# Patient Record
Sex: Male | Born: 1945 | Race: White | Hispanic: No | Marital: Married | Smoking: Former smoker
Health system: Southern US, Community
[De-identification: ages and names within clinical notes are randomized; demographics above are authoritative.]

## PROBLEM LIST (undated history)

## (undated) DIAGNOSIS — I10 Essential (primary) hypertension: Secondary | ICD-10-CM

---

## 2004-06-16 ENCOUNTER — Ambulatory Visit: Payer: Self-pay | Admitting: Unknown Physician Specialty

## 2004-11-29 ENCOUNTER — Emergency Department: Payer: Self-pay | Admitting: Emergency Medicine

## 2007-08-26 ENCOUNTER — Ambulatory Visit: Payer: Self-pay | Admitting: Unknown Physician Specialty

## 2010-03-09 ENCOUNTER — Ambulatory Visit: Payer: Self-pay | Admitting: Unknown Physician Specialty

## 2010-03-13 LAB — PATHOLOGY REPORT

## 2010-04-05 ENCOUNTER — Ambulatory Visit: Payer: Self-pay | Admitting: Family Medicine

## 2010-04-09 ENCOUNTER — Emergency Department: Payer: Self-pay | Admitting: Emergency Medicine

## 2010-04-12 ENCOUNTER — Ambulatory Visit: Payer: Self-pay | Admitting: Physician Assistant

## 2013-03-19 ENCOUNTER — Ambulatory Visit: Payer: Self-pay | Admitting: Unknown Physician Specialty

## 2013-03-23 LAB — PATHOLOGY REPORT

## 2015-04-07 ENCOUNTER — Other Ambulatory Visit (HOSPITAL_COMMUNITY): Payer: Self-pay | Admitting: Internal Medicine

## 2015-04-07 ENCOUNTER — Other Ambulatory Visit: Payer: Self-pay | Admitting: Internal Medicine

## 2015-04-07 DIAGNOSIS — Z7189 Other specified counseling: Secondary | ICD-10-CM

## 2015-04-13 ENCOUNTER — Ambulatory Visit (HOSPITAL_COMMUNITY)
Admission: RE | Admit: 2015-04-13 | Discharge: 2015-04-13 | Disposition: A | Discharge status: Home / Self Care | Payer: Medicare Other | Source: Ambulatory Visit | Attending: Internal Medicine | Admitting: Internal Medicine

## 2015-04-13 DIAGNOSIS — I517 Cardiomegaly: Secondary | ICD-10-CM | POA: Insufficient documentation

## 2015-04-13 DIAGNOSIS — I2510 Atherosclerotic heart disease of native coronary artery without angina pectoris: Secondary | ICD-10-CM | POA: Insufficient documentation

## 2015-04-13 DIAGNOSIS — Z7189 Other specified counseling: Secondary | ICD-10-CM | POA: Insufficient documentation

## 2015-04-13 DIAGNOSIS — M5134 Other intervertebral disc degeneration, thoracic region: Secondary | ICD-10-CM | POA: Diagnosis not present

## 2019-05-01 ENCOUNTER — Emergency Department: Payer: Medicare Other

## 2019-05-01 ENCOUNTER — Encounter: Payer: Self-pay | Admitting: Emergency Medicine

## 2019-05-01 ENCOUNTER — Other Ambulatory Visit: Payer: Self-pay

## 2019-05-01 ENCOUNTER — Emergency Department
Admission: EM | Admit: 2019-05-01 | Discharge: 2019-05-01 | Disposition: A | Discharge status: Home / Self Care | Payer: Medicare Other | Attending: Emergency Medicine | Admitting: Emergency Medicine

## 2019-05-01 DIAGNOSIS — R079 Chest pain, unspecified: Secondary | ICD-10-CM | POA: Diagnosis present

## 2019-05-01 DIAGNOSIS — Z5321 Procedure and treatment not carried out due to patient leaving prior to being seen by health care provider: Secondary | ICD-10-CM | POA: Insufficient documentation

## 2019-05-01 LAB — BASIC METABOLIC PANEL
Anion gap: 10 (ref 5–15)
BUN: 15 mg/dL (ref 8–23)
CO2: 26 mmol/L (ref 22–32)
Calcium: 9.3 mg/dL (ref 8.9–10.3)
Chloride: 95 mmol/L — ABNORMAL LOW (ref 98–111)
Creatinine, Ser: 0.9 mg/dL (ref 0.61–1.24)
GFR calc Af Amer: >60 mL/min (ref >60)
GFR calc non Af Amer: >60 mL/min (ref >60)
Glucose, Bld: 107 mg/dL — ABNORMAL HIGH (ref 70–99)
Potassium: 3.7 mmol/L (ref 3.5–5.1)
Sodium: 131 mmol/L — ABNORMAL LOW (ref 135–145)

## 2019-05-01 LAB — CBC
HCT: 37.9 % — ABNORMAL LOW (ref 39.0–52.0)
Hemoglobin: 13.6 g/dL (ref 13.0–17.0)
MCH: 30.6 pg (ref 26.0–34.0)
MCHC: 35.9 g/dL (ref 30.0–36.0)
MCV: 85.2 fL (ref 80.0–100.0)
Platelets: 204 10*3/uL (ref 150–400)
RBC: 4.45 MIL/uL (ref 4.22–5.81)
RDW: 13 % (ref 11.5–15.5)
WBC: 11.2 10*3/uL — ABNORMAL HIGH (ref 4.0–10.5)
nRBC: 0 % (ref 0.0–0.2)

## 2019-05-01 LAB — TROPONIN I (HIGH SENSITIVITY)
Troponin I (High Sensitivity): 4 ng/L (ref <18)
Troponin I (High Sensitivity): 5 ng/L (ref <18)

## 2019-05-01 MED ORDER — SODIUM CHLORIDE 0.9% FLUSH: 3.0000 mL | Freq: Once | INTRAVENOUS | Status: DC

## 2019-05-01 NOTE — ED Triage Notes (Signed)
Pt to ED via POV c/o chest pain and shortness of breath that started today. Pt states that the pain and shortness of breath has persisted throughout the day. Pain is generalized. Pt denies any other symptoms. Pt is in NAD. No cardiac hx per pt

## 2019-05-01 NOTE — ED Notes (Signed)
Pt up to desk to check on status, BP rechecked, EKG obtained.

## 2019-05-01 NOTE — ED Notes (Signed)
Patient's family member up to stat desk to ask about wait times. Patient updated by Colletta Maryland NT.

## 2019-05-01 NOTE — ED Notes (Signed)
Patient up to stat desk asking about wait times. Patient informed of current number of patients ahead of him to go back. Patient reported he was leaving. Patient cautioned against dangers of leaving AMA. Patient verbalized understanding of information discussed. Patient left.

## 2019-05-22 ENCOUNTER — Ambulatory Visit
Admission: EM | Admit: 2019-05-22 | Discharge: 2019-05-22 | Disposition: A | Discharge status: Home / Self Care | Payer: Medicare Other

## 2019-05-22 ENCOUNTER — Encounter: Payer: Self-pay | Admitting: *Deleted

## 2019-05-22 DIAGNOSIS — R5383 Other fatigue: Secondary | ICD-10-CM | POA: Diagnosis not present

## 2019-05-22 DIAGNOSIS — R000 Tachycardia, unspecified: Secondary | ICD-10-CM

## 2019-05-22 HISTORY — DX: Essential (primary) hypertension: I10

## 2019-05-22 NOTE — ED Provider Notes (Addendum)
Joshua Barrett    CSN: [id number] Arrival date & time: 05/22/19  1115      History   Chief Complaint Chief Complaint  Patient presents with  . Fatigue  . Cough    HPI Joshua Barrett is a 73 y.o. male.   Patient reports to urgent care today for 2-3 weeks of fatigue. Patient reports that he has felt generally fatigued since around Thanksgiving time. He also reports some episodes of what he thinks may have been exertional shortness of breath but also believes his general fatigue could be causing this.  He denies any chest pain today but describes pain across his shoulders, with his left shoulder being his primary problem. He describes the pain as "achey".  He denies associated nausea, vomiting or sweating. He denies fever or chills. He reports having to clear his throat occasionally. Denies sore throat, sinus congestion or runny nose currently but endorses occasional "sniffles" in the last 2 weeks. His symptoms have not worsened over the course but have also not largely improved.  Of note, Patient had cardiac work up on 11/20 at [facility] in the ED to include ECG and Troponins with results show no acute cardiac disease.  He was seen in the [facility] Cardiac clinic on 11/21 and 11/23 for similar complaints and found to be tachycardic as well. Had ECHO and stress test ordered. He was put on 25mg  Metroprolol. He reports not taking this medication over the last 1 week. His tests were rescheduled for 12/23 and follow up with provider on 12/28.   He was also seen by a provider for a shoulder injection, which gave relief to his should related pain but reports that has worn off.      Past Medical History:  Diagnosis Date  . Hypertension     There are no problems to display for this patient.   History reviewed. No pertinent surgical history.     Home Medications    Prior to Admission medications   Medication Sig Start Date End Date Taking? Authorizing Provider  atorvastatin  (LIPITOR) 20 MG tablet Take 20 mg by mouth daily.   Yes [provider]  lisinopril-hydrochlorothiazide (ZESTORETIC) 20-25 MG tablet Take 1 tablet by mouth daily.   Yes [provider]    Family History History reviewed. No pertinent family history.  Social History Social History   Tobacco Use  . Smoking status: Former Smoker    Types: Cigars  . Smokeless tobacco: Never Used  Substance Use Topics  . Alcohol use: Not on file  . Drug use: Not on file     Allergies   Patient has no known allergies.   Review of Systems Review of Systems  Constitutional: Positive for activity change, appetite change and fatigue. Negative for fever.  HENT: Negative for congestion, ear pain, rhinorrhea and sore throat.   Eyes: Negative for visual disturbance.  Respiratory: Positive for shortness of breath. Negative for cough.   Cardiovascular: Negative for chest pain, palpitations and leg swelling.  Gastrointestinal: Negative for abdominal pain, diarrhea, nausea and vomiting.  Genitourinary: Negative for dysuria and hematuria.  Musculoskeletal: Positive for arthralgias, back pain and myalgias.  Skin: Negative for color change and rash.  Neurological: Negative for syncope.  All other systems reviewed and are negative.    Physical Exam Triage Vital Signs ED Triage Vitals  Enc Vitals Group     BP 05/22/19 1122 (!) 145/81     Pulse Rate 05/22/19 1122 (!) 108  Resp 05/22/19 1122 18     Temp 05/22/19 1122 99.1 F (37.3 C)     Temp Source 05/22/19 1122 Oral     SpO2 05/22/19 1122 96 %     Weight --      Height --      Head Circumference --      Peak Flow --      Pain Score 05/22/19 1119 0     Pain Loc --      Pain Edu? --      Excl. in GC? --    No data found.  Updated Vital Signs BP (!) 145/81 (BP Location: Left Arm)   Pulse (!) 108   Temp 99.1 F (37.3 C) (Oral)   Resp 18   SpO2 96%   Visual Acuity Right Eye Distance:   Left Eye Distance:   Bilateral  Distance:    Right Eye Near:   Left Eye Near:    Bilateral Near:     Physical Exam Vitals and nursing note reviewed.  Constitutional:      General: He is not in acute distress.    Appearance: Normal appearance. He is well-developed. He is not ill-appearing.  HENT:     Head: Normocephalic and atraumatic.     Right Ear: Tympanic membrane normal.     Left Ear: Tympanic membrane and ear canal normal.  Eyes:     Conjunctiva/sclera: Conjunctivae normal.  Cardiovascular:     Rate and Rhythm: Regular rhythm. Tachycardia present.     Pulses: Normal pulses.     Heart sounds: Normal heart sounds. No murmur.  Pulmonary:     Effort: Pulmonary effort is normal. No respiratory distress.     Breath sounds: Normal breath sounds. No wheezing, rhonchi or rales.  Chest:     Chest wall: No tenderness.  Abdominal:     General: Bowel sounds are normal.     Palpations: Abdomen is soft.     Tenderness: There is no abdominal tenderness.  Musculoskeletal:        General: No tenderness. Normal range of motion.     Cervical back: Normal range of motion and neck supple. No tenderness.     Right lower leg: No edema.     Left lower leg: No edema.  Lymphadenopathy:     Cervical: No cervical adenopathy.  Skin:    General: Skin is warm and dry.     Coloration: Skin is not jaundiced.     Findings: No bruising or rash.  Neurological:     General: No focal deficit present.     Mental Status: He is alert and oriented to person, place, and time.     Cranial Nerves: No cranial nerve deficit.     Motor: No weakness.     Gait: Gait normal.  Psychiatric:        Mood and Affect: Mood normal.        Behavior: Behavior normal.        Judgment: Judgment normal.      UC Treatments / Results  Labs (all labs ordered are listed, but only abnormal results are displayed) Labs Reviewed  NOVEL CORONAVIRUS, NAA    EKG Sinus Tachycardia with PAC's. Rate at 114. No ST elevation.  - PAC's new from 11/20 ECG,  otherwise no new findings.  Radiology No results found.  Procedures Procedures (including critical care time)  Medications Ordered in UC Medications - No data to display  Initial Impression / Assessment and Plan /  UC Course  I have reviewed the triage vital signs and the nursing notes.  Pertinent labs & imaging results that were available during my care of the patient were reviewed by me and considered in my medical decision making (see chart for details).     #Fatigue  #Tachycardia 2-3 weeks of fatigue. ECG today PAC's but otherwise unchanged from previous. Symptoms have remained stable. He has not continued to take his metoprolol. Send out Covid PCR was sent based on fatigue and occasional upper respiratory symptoms, do not believe this is a bacterial infection at this point. As well based on ECG showing no acute irregularities and lack of chest pain, dont feel there is acute cardiac pathology.  Has Stress test scheduled for 12/23 and cardiology f/u on 12/28, recommended close PCP follow up and contacting cardiologist to expedite testing or appt. Recommend to follow previous cardiology recommendations for metoprolol.   Final Clinical Impressions(s) / UC Diagnoses   Final diagnoses:  Fatigue, unspecified type  Tachycardia with heart rate 100-120 beats per minute     Discharge Instructions     If your Covid-19 test is positive, you will receive a phone call from [facility] regarding your results. Negative test results are not called. Both positive and negative results area always visible on MyChart. If you do not have a MyChart account, sign up instructions are in your discharge papers.   Persons who are directed to care for themselves at home may discontinue isolation under the following conditions:  . At least 10 days have passed since symptom onset and . At least 24 hours have passed without running a fever (this means without the use of fever-reducing medications)  and . Other symptoms have improved.  Persons infected with COVID-19 who never develop symptoms may discontinue isolation and other precautions 10 days after the date of their first positive COVID-19 test.   We have performed an ECG to ensure you do not have an irregular heart beat. It did not show significant changes from your last.   We would like a close follow up with your Primary Care or your cardiologist to occur next week on Monday.   Go to the Emergency Department or Call 911 should you have crushing chest pain, chest pain that radiates into your left arm or jaw, nausea or become sweaty with the chest pain, feel as though you might pass out, have an irregular heart rate or feel as though your heart is racing.     ED Prescriptions    None     PDMP not reviewed this encounter.   Purnell Shoemaker, PA-C 05/22/19 1300    Jana Swartzlander, Marguerita Beards, PA-C 05/22/19 1303

## 2019-05-22 NOTE — ED Triage Notes (Signed)
Patient reports he was seen on the 20th of November for chest pain in the ED, has follow up with cards for stress test. States that he thinks it was his shoulder, received steriod injection to left shoulder for bursitis. Patient states for last week that he was had fatigue, achey and this cough (describes it as a clearing of his throat). Denies any COVID exposure.

## 2019-05-22 NOTE — Discharge Instructions (Addendum)
If your Covid-19 test is positive, you will receive a phone call from [facility] regarding your results. Negative test results are not called. Both positive and negative results area always visible on MyChart. If you do not have a MyChart account, sign up instructions are in your discharge papers.   Persons who are directed to care for themselves at home may discontinue isolation under the following conditions:   At least 10 days have passed since symptom onset and  At least 24 hours have passed without running a fever (this means without the use of fever-reducing medications) and  Other symptoms have improved.  Persons infected with COVID-19 who never develop symptoms may discontinue isolation and other precautions 10 days after the date of their first positive COVID-19 test.   We have performed an ECG to ensure you do not have an irregular heart beat. It did not show significant changes from your last.   We would like a close follow up with your Primary Care or your cardiologist to occur next week on Monday.   Go to the Emergency Department or Call 911 should you have crushing chest pain, chest pain that radiates into your left arm or jaw, nausea or become sweaty with the chest pain, feel as though you might pass out, have an irregular heart rate or feel as though your heart is racing.

## 2019-05-23 LAB — NOVEL CORONAVIRUS, NAA: SARS-CoV-2, NAA: NOT DETECTED

## 2019-08-01 ENCOUNTER — Other Ambulatory Visit: Payer: Self-pay

## 2019-08-01 ENCOUNTER — Ambulatory Visit: Payer: Medicare Other | Attending: Internal Medicine

## 2019-08-01 DIAGNOSIS — Z23 Encounter for immunization: Secondary | ICD-10-CM

## 2019-08-01 NOTE — Progress Notes (Signed)
   Covid-19 Vaccination Clinic  Name:  Joshua Barrett    MRN: [id number] DOB: [birth year]  08/01/2019  [name] was observed post Covid-19 immunization for 15 minutes without incidence. He was provided with Vaccine Information Sheet and instruction to access the V-Safe system.   [name] was instructed to call 911 with any severe reactions post vaccine: Marland Kitchen Difficulty breathing  . Swelling of your face and throat  . A fast heartbeat  . A bad rash all over your body  . Dizziness and weakness    Immunizations Administered    Name Date Dose VIS Date Route   Pfizer COVID-19 Vaccine 08/01/2019 10:21 AM 0.3 mL 05/22/2019 Intramuscular   Manufacturer: [employer], [employer]   Lot: [id number]   NDC: [address]-0

## 2019-08-25 ENCOUNTER — Ambulatory Visit: Payer: Medicare Other | Attending: Internal Medicine

## 2019-08-25 DIAGNOSIS — Z23 Encounter for immunization: Secondary | ICD-10-CM

## 2019-08-25 NOTE — Progress Notes (Signed)
   Covid-19 Vaccination Clinic  Name:  Joshua Barrett    MRN: [id number] DOB: [birth year]  08/25/2019  [name] was observed post Covid-19 immunization for 15 minutes without incident. He was provided with Vaccine Information Sheet and instruction to access the V-Safe system.   [name] was instructed to call 911 with any severe reactions post vaccine: Marland Kitchen Difficulty breathing  . Swelling of face and throat  . A fast heartbeat  . A bad rash all over body  . Dizziness and weakness   Immunizations Administered    Name Date Dose VIS Date Route   Pfizer COVID-19 Vaccine 08/25/2019 11:03 AM 0.3 mL 05/22/2019 Intramuscular   Manufacturer: [employer], [employer]   Lot: [id number]   NDC: [address]-6

## 2019-10-16 ENCOUNTER — Encounter: Payer: Self-pay | Admitting: Urology

## 2019-11-11 ENCOUNTER — Other Ambulatory Visit: Payer: Self-pay

## 2019-11-11 ENCOUNTER — Ambulatory Visit (INDEPENDENT_AMBULATORY_CARE_PROVIDER_SITE_OTHER): Payer: Medicare Other | Admitting: Urology

## 2019-11-11 ENCOUNTER — Encounter: Payer: Self-pay | Admitting: Urology

## 2019-11-11 VITALS — BP 143/82 | HR 94 | Ht 72.0 in | Wt 200.0 lb

## 2019-11-11 DIAGNOSIS — R972 Elevated prostate specific antigen [PSA]: Secondary | ICD-10-CM

## 2019-11-11 DIAGNOSIS — R350 Frequency of micturition: Secondary | ICD-10-CM | POA: Diagnosis not present

## 2019-11-11 DIAGNOSIS — N401 Enlarged prostate with lower urinary tract symptoms: Secondary | ICD-10-CM | POA: Diagnosis not present

## 2019-11-11 NOTE — Progress Notes (Signed)
   11/10/19 7:22 AM   Joshua Barrett July 08, 1945 [id number]  Referring provider: Jonette Mate, MD [address] [place],  [place] [address] Chief Complaint  Patient presents with  . Elevated PSA    HPI: Joshua Barrett is a 74 y.o. male who presents for the evaluation of an elevated of PSA of 4.89.   -Mild to moderate LUTS with frequency, nocturia as his most bothersome symptoms; gets up about 3am and then about every 3 hours after at nighttime -Reports that he drinks coffee and fluids especially during the day. -Also will consume fluids after dinnertime -Occasional decreased stream but it is also good on occasions   PSA trend:    PMH: Past Medical History:  Diagnosis Date  . Hypertension     Surgical History: History reviewed. No pertinent surgical history.  Home Medications:  Allergies as of 11/11/2019   No Known Allergies     Medication List       Accurate as of November 11, 2019 11:59 PM. If you have any questions, ask your nurse or doctor.        atorvastatin 20 MG tablet Commonly known as: LIPITOR Take 20 mg by mouth daily.   lisinopril-hydrochlorothiazide 20-25 MG tablet Commonly known as: ZESTORETIC Take 1 tablet by mouth daily.   metoprolol tartrate 25 MG tablet Commonly known as: LOPRESSOR metoprolol tartrate 25 mg tablet       Allergies: No Known Allergies  Family History: History reviewed. No pertinent family history.  Social History:  reports that he has quit smoking. His smoking use included cigars. He has never used smokeless tobacco. No history on file for alcohol and drug.   Physical Exam: BP (!) 143/82   Pulse 94   Ht 6' (1.829 m)   Wt 200 lb (90.7 kg)   BMI 27.12 kg/m   Constitutional:  Alert and oriented, No acute distress. HEENT: [place] AT, moist mucus membranes.  Trachea midline, no masses. Cardiovascular: No clubbing, cyanosis, or edema. Respiratory: Normal respiratory effort, no increased work of  breathing. GU: No CVA tenderness. Prostate 50 grams, smooth without nodules Skin: No rashes, bruises or suspicious lesions. Neurologic: Grossly intact, no focal deficits, moving all 4 extremities. Psychiatric: Normal mood and affect.   Assessment & Plan:    1. Elevated PSA -Minimally elevated by strict criteria and normal by age specific guidelines -Benign DRE -The incidence of clinically significant prostate cancer is low at < 5% -Discussed options of prostate biopsy verses surveillance. -He has elected surveillance -Repeat his PSA in 1 year  2. BPH with LUTS -Discussed cutting back on fluids especially after dinner -Discussed available medications and outlet procedures for BPH -His symptoms are not bothersome and he would like to cut back on fluids initially  [facility] [address], [address] [place], [place] [address] [phone]  I, Francina Ames Peace, am acting as a Neurosurgeon for Dr. Lorin Picket C. Domingo Fuson.  I have reviewed the above documentation for accuracy and completeness, and I agree with the above.   Riki Altes, MD

## 2019-11-13 ENCOUNTER — Encounter: Payer: Self-pay | Admitting: Urology

## 2019-11-13 DIAGNOSIS — R972 Elevated prostate specific antigen [PSA]: Secondary | ICD-10-CM | POA: Insufficient documentation

## 2019-11-13 DIAGNOSIS — N401 Enlarged prostate with lower urinary tract symptoms: Secondary | ICD-10-CM | POA: Insufficient documentation

## 2020-11-09 ENCOUNTER — Other Ambulatory Visit: Payer: Self-pay

## 2020-11-09 ENCOUNTER — Other Ambulatory Visit: Payer: Medicare Other

## 2020-11-09 DIAGNOSIS — R972 Elevated prostate specific antigen [PSA]: Secondary | ICD-10-CM

## 2020-11-10 ENCOUNTER — Encounter: Payer: Self-pay | Admitting: Urology

## 2020-11-10 ENCOUNTER — Ambulatory Visit (INDEPENDENT_AMBULATORY_CARE_PROVIDER_SITE_OTHER): Payer: Medicare Other | Admitting: Urology

## 2020-11-10 VITALS — BP 163/78 | HR 101 | Ht 72.0 in | Wt 200.0 lb

## 2020-11-10 DIAGNOSIS — N401 Enlarged prostate with lower urinary tract symptoms: Secondary | ICD-10-CM

## 2020-11-10 DIAGNOSIS — R972 Elevated prostate specific antigen [PSA]: Secondary | ICD-10-CM | POA: Diagnosis not present

## 2020-11-10 DIAGNOSIS — R8281 Pyuria: Secondary | ICD-10-CM

## 2020-11-10 DIAGNOSIS — R3911 Hesitancy of micturition: Secondary | ICD-10-CM

## 2020-11-10 DIAGNOSIS — R350 Frequency of micturition: Secondary | ICD-10-CM | POA: Diagnosis not present

## 2020-11-10 LAB — PSA: Prostate Specific Ag, Serum: 72.3 ng/mL — ABNORMAL HIGH (ref 0.0–4.0)

## 2020-11-10 LAB — BLADDER SCAN AMB NON-IMAGING: Scan Result: 107

## 2020-11-10 MED ORDER — TAMSULOSIN HCL 0.4 MG PO CAPS: 0.4000 mg | ORAL_CAPSULE | Freq: Every day | ORAL | 3 refills | Status: DC

## 2020-11-10 NOTE — Progress Notes (Signed)
   11/10/2020 3:17 PM   Joshua Barrett 18-Sep-1945 [id number]  Referring provider: Rafael Bihari, MD No address on file  Chief Complaint  Patient presents with  . Elevated PSA    HPI: 75 y.o. male presents for follow-up of an elevated PSA.   Initially seen 11/11/2019 for PSA 4.89  He elected surveillance PSA with his PCP 10/05/2020 was 4.79  Since last years visit he has had worsening urinary hesitancy, decreased force and caliber of his urinary stream and nocturia x3  Notes mild dysuria on occasion  He was scheduled for a lab visit for PSA prior to this visit which was drawn yesterday and scheduling not aware that he had his PSA drawn in late April   PSA drawn yesterday significant elevated at 12   PMH: Past Medical History:  Diagnosis Date  . Hypertension     Surgical History: History reviewed. No pertinent surgical history.  Home Medications:  Allergies as of 11/10/2020   No Known Allergies     Medication List       Accurate as of November 10, 2020  3:17 PM. If you have any questions, ask your nurse or doctor.        atorvastatin 20 MG tablet Commonly known as: LIPITOR Take 20 mg by mouth daily.   lisinopril-hydrochlorothiazide 20-25 MG tablet Commonly known as: ZESTORETIC Take 1 tablet by mouth daily.   metoprolol tartrate 25 MG tablet Commonly known as: LOPRESSOR metoprolol tartrate 25 mg tablet   traZODone 100 MG tablet Commonly known as: DESYREL Take 1 tablet by mouth at bedtime.       Allergies: No Known Allergies  Family History: History reviewed. No pertinent family history.  Social History:  reports that he has quit smoking. His smoking use included cigars. He has never used smokeless tobacco. No history on file for alcohol use and drug use.   Physical Exam: There were no vitals taken for this visit.  Constitutional:  Alert and oriented, No acute distress. HEENT: [place] AT, moist mucus membranes.  Trachea midline, no  masses. Cardiovascular: No clubbing, cyanosis, or edema. Respiratory: Normal respiratory effort, no increased work of breathing. GI: Abdomen is soft, nontender, nondistended, no abdominal masses GU: 50 g, smooth without nodules Lymph: No cervical or inguinal lymphadenopathy. Skin: No rashes, bruises or suspicious lesions. Neurologic: Grossly intact, no focal deficits, moving all 4 extremities. Psychiatric: Normal mood and affect.  Laboratory Data:  Urinalysis Dipstick 1+ leukocyte/1+ blood Microscopy >30 WBC/3-10 RBC/moderate bacteria    Assessment & Plan:    1.  Elevated PSA  Mild PSA elevation by strict criteria which has been stable and most likely secondary to BPH.  PSA normal by age guidelines  Significant PSA increase reflected and yesterday's blood draw due to infection  Urine culture was ordered and will await results prior to antibiotic therapy  Benign DRE  2.  BPH with LUTS  Bothersome LUTS which have worsened in the last 12 months  Trial tamsulosin 0.4 mg  Bladder scan PVR 107 mL    Riki Altes, MD  [facility] [address] [place], [place] [address] [phone]

## 2020-11-11 LAB — URINALYSIS, COMPLETE
Bilirubin, UA: NEGATIVE
Glucose, UA: NEGATIVE
Ketones, UA: NEGATIVE
Nitrite, UA: NEGATIVE
Specific Gravity, UA: 1.025 (ref 1.005–1.030)
Urobilinogen, Ur: 0.2 mg/dL (ref 0.2–1.0)
pH, UA: 6 (ref 5.0–7.5)

## 2020-11-11 LAB — MICROSCOPIC EXAMINATION: WBC, UA: >30 /hpf — AB (ref 0–5)

## 2020-11-15 ENCOUNTER — Other Ambulatory Visit: Payer: Self-pay | Admitting: Urology

## 2020-11-15 LAB — CULTURE, URINE COMPREHENSIVE

## 2020-11-15 MED ORDER — NITROFURANTOIN MONOHYD MACRO 100 MG PO CAPS: 100.0000 mg | ORAL_CAPSULE | Freq: Two times a day (BID) | ORAL | 0 refills | Status: AC

## 2020-11-15 NOTE — Progress Notes (Signed)
Sched lab visit for repeat ua and urine c/s 2 weeks

## 2020-11-16 ENCOUNTER — Telehealth: Payer: Self-pay | Admitting: *Deleted

## 2020-11-16 ENCOUNTER — Encounter: Payer: Self-pay | Admitting: *Deleted

## 2020-11-16 NOTE — Telephone Encounter (Signed)
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  Rx Macrobid was sent to pharmacy however this may not achieve adequate tissue penetration and he may need ID evaluation

## 2020-11-16 NOTE — Telephone Encounter (Signed)
Notified patient as instructed, patient pleased. Discussed follow-up appointments, patient agrees Sent my chart message to.

## 2020-12-01 ENCOUNTER — Other Ambulatory Visit: Payer: Self-pay

## 2020-12-01 ENCOUNTER — Other Ambulatory Visit: Payer: Self-pay | Admitting: *Deleted

## 2020-12-01 ENCOUNTER — Other Ambulatory Visit: Payer: Medicare Other

## 2020-12-01 DIAGNOSIS — R3911 Hesitancy of micturition: Secondary | ICD-10-CM

## 2020-12-01 DIAGNOSIS — N401 Enlarged prostate with lower urinary tract symptoms: Secondary | ICD-10-CM

## 2020-12-01 LAB — URINALYSIS, COMPLETE
Bilirubin, UA: NEGATIVE
Glucose, UA: NEGATIVE
Ketones, UA: NEGATIVE
Leukocytes,UA: NEGATIVE
Nitrite, UA: NEGATIVE
Protein,UA: NEGATIVE
RBC, UA: NEGATIVE
Specific Gravity, UA: 1.015 (ref 1.005–1.030)
Urobilinogen, Ur: 0.2 mg/dL (ref 0.2–1.0)
pH, UA: 6.5 (ref 5.0–7.5)

## 2020-12-01 LAB — MICROSCOPIC EXAMINATION
Bacteria, UA: NONE SEEN
Epithelial Cells (non renal): NONE SEEN /hpf (ref 0–10)
RBC, Urine: NONE SEEN /hpf (ref 0–2)

## 2020-12-04 LAB — CULTURE, URINE COMPREHENSIVE

## 2020-12-05 ENCOUNTER — Encounter: Payer: Self-pay | Admitting: *Deleted

## 2021-02-09 ENCOUNTER — Other Ambulatory Visit: Payer: Self-pay | Admitting: Urology

## 2021-08-07 IMAGING — CR DG CHEST 2V
1 series · 2 of 2 positions shown · non-contrast
Comparison: CT heart calcium score exam 04/13/2015

CLINICAL DATA: Chest pain. Additional history provided: Patient
reports chest pain and shortness of breath that started today.

EXAM:
CHEST - 2 VIEW

[Series 1: dg chest 2 view · 0.14mm/px · 2 of 2 slices shown]
[im 1/2]
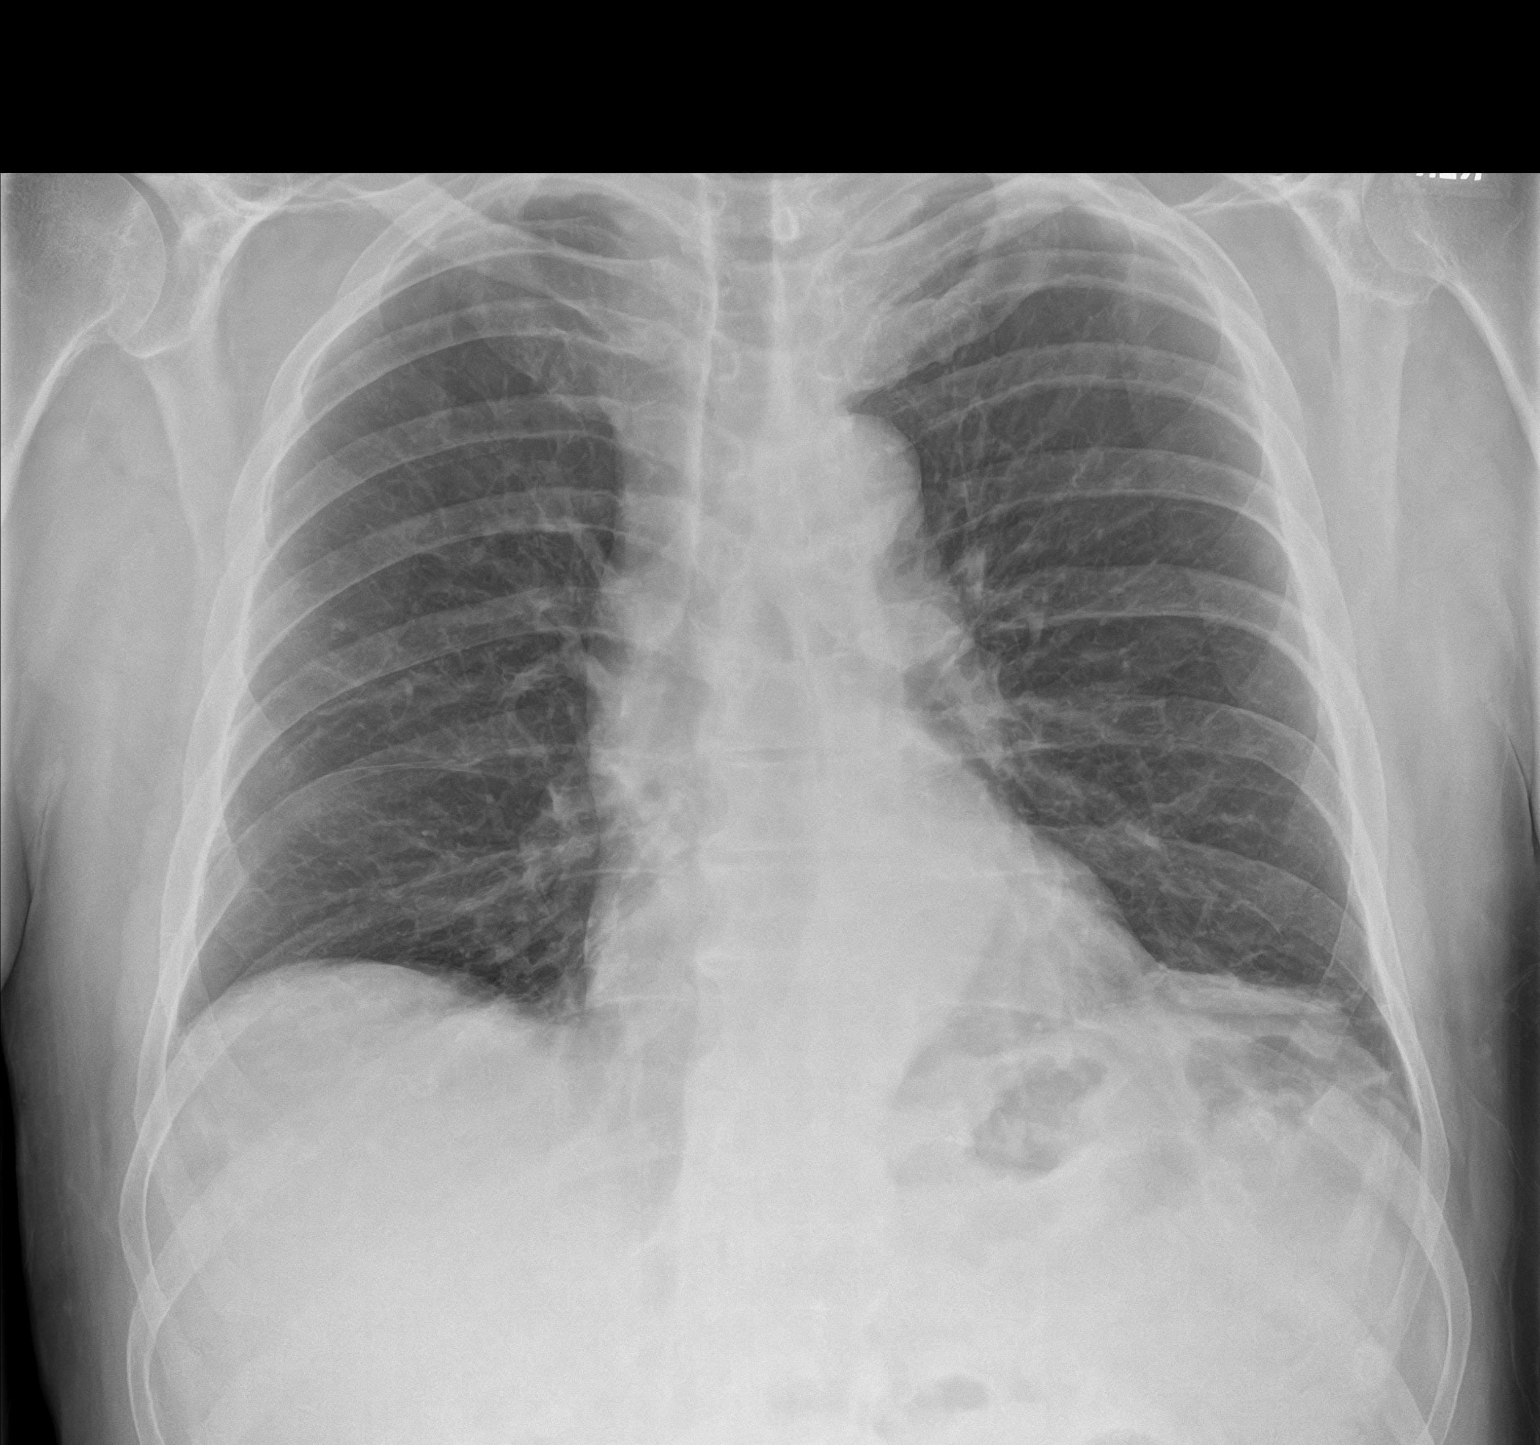
[im 2/2]
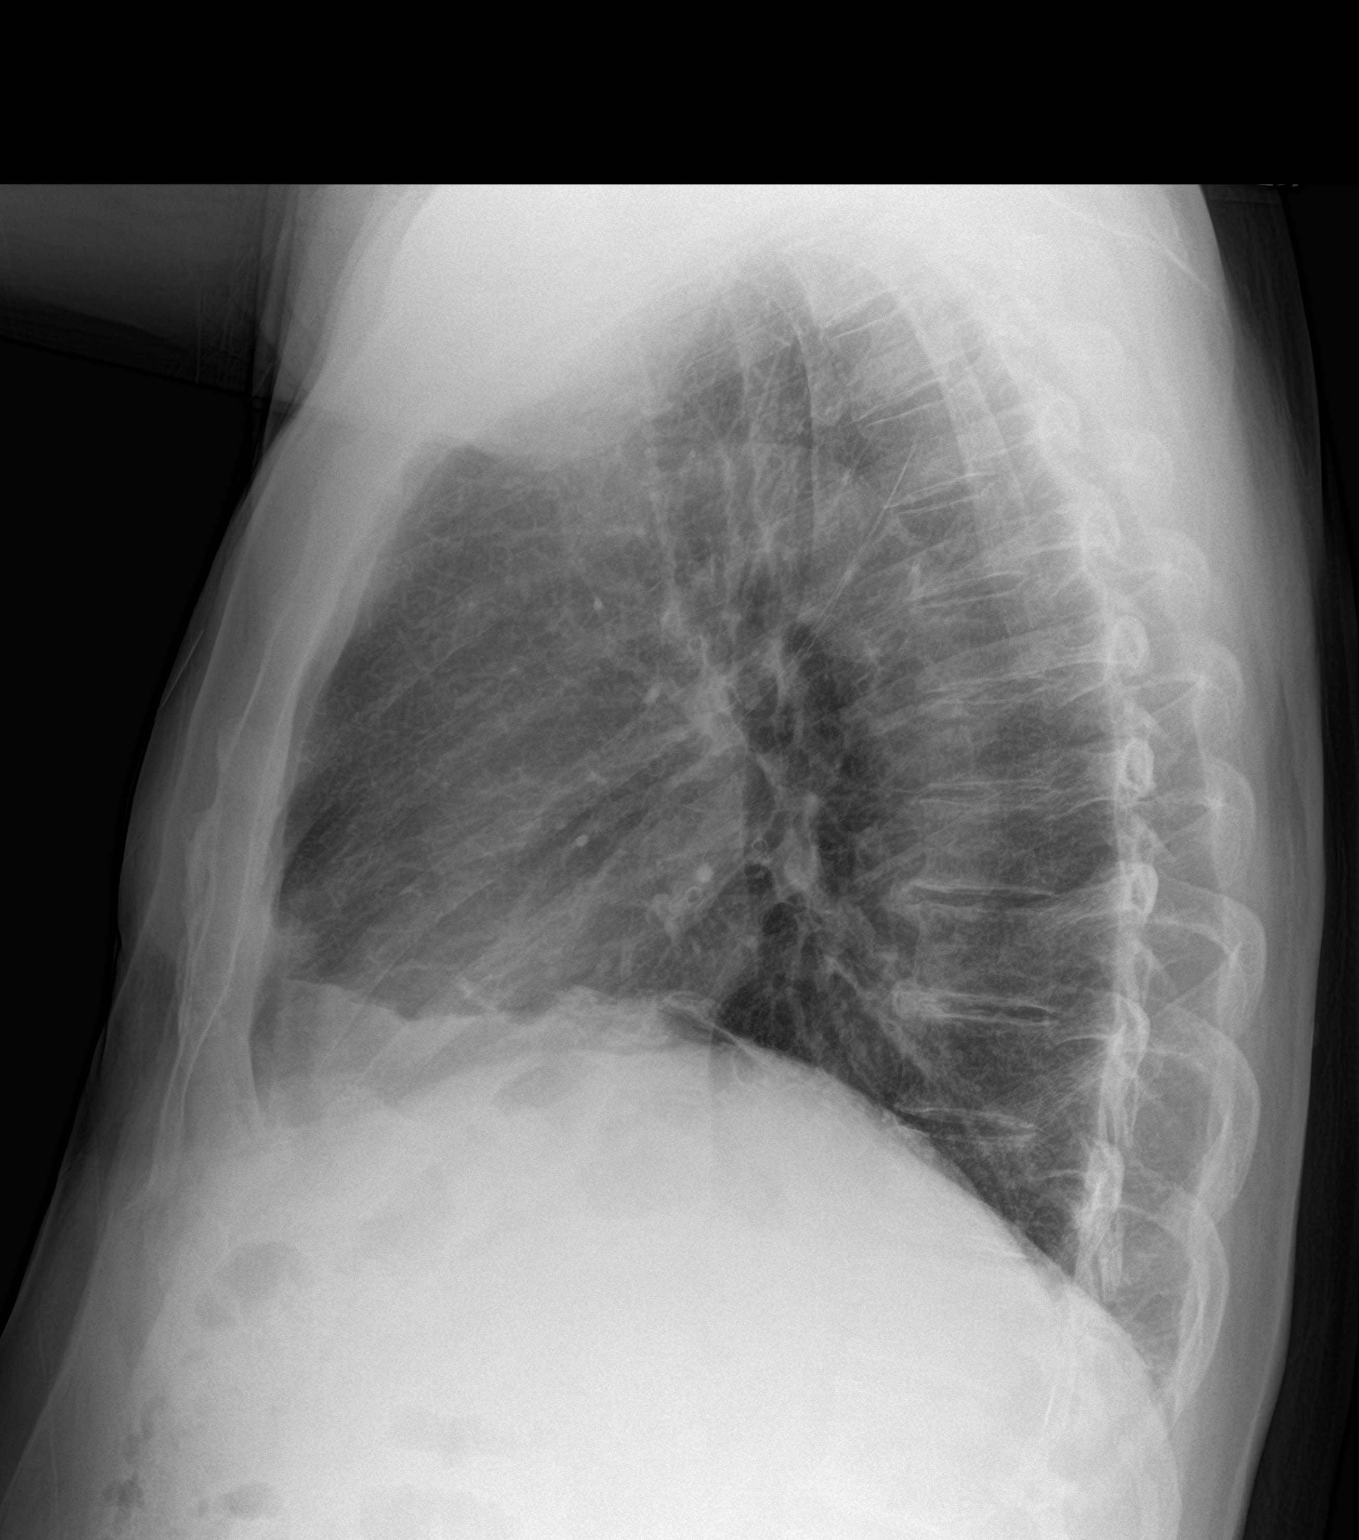

[2 of 2 positions shown; findings below may reference images not displayed]

FINDINGS: Heart size within normal limits. Mild incomplete atelectasis versus
scarring at the left lung base. Biapical pleuroparenchymal scarring
also present. No airspace consolidation. No pleural effusion or
pneumothorax. No acute bony abnormality.
IMPRESSION: No airspace consolidation

Mild incomplete atelectasis versus scarring at the left lung base.
Biapical pleuroparenchymal scarring also present.

## 2021-12-06 ENCOUNTER — Ambulatory Visit (INDEPENDENT_AMBULATORY_CARE_PROVIDER_SITE_OTHER): Payer: Medicare Other | Admitting: Urology

## 2021-12-06 ENCOUNTER — Encounter: Payer: Self-pay | Admitting: Urology

## 2021-12-06 VITALS — BP 194/99 | HR 90 | Ht 72.0 in | Wt 210.0 lb

## 2021-12-06 DIAGNOSIS — N401 Enlarged prostate with lower urinary tract symptoms: Secondary | ICD-10-CM

## 2021-12-06 DIAGNOSIS — R8281 Pyuria: Secondary | ICD-10-CM

## 2021-12-06 DIAGNOSIS — R972 Elevated prostate specific antigen [PSA]: Secondary | ICD-10-CM | POA: Diagnosis not present

## 2021-12-06 DIAGNOSIS — R3911 Hesitancy of micturition: Secondary | ICD-10-CM

## 2021-12-06 LAB — URINALYSIS, COMPLETE
Bilirubin, UA: NEGATIVE
Glucose, UA: NEGATIVE
Ketones, UA: NEGATIVE
Leukocytes,UA: NEGATIVE
Nitrite, UA: NEGATIVE
Protein,UA: NEGATIVE
RBC, UA: NEGATIVE
Specific Gravity, UA: 1.02 (ref 1.005–1.030)
Urobilinogen, Ur: 0.2 mg/dL (ref 0.2–1.0)
pH, UA: 7 (ref 5.0–7.5)

## 2021-12-06 LAB — MICROSCOPIC EXAMINATION
Bacteria, UA: NONE SEEN
RBC, Urine: NONE SEEN /hpf (ref 0–2)

## 2021-12-06 NOTE — Progress Notes (Signed)
   12/06/2021 3:17 PM   Joshua Barrett May 24, 1946 [id number]  Referring provider: Rafael Bihari, MD No address on file  Chief Complaint  Patient presents with   pyuria    HPI: 76 y.o. male presents for follow-up of an elevated PSA.  Initially seen 11/11/2019 for PSA 4.89 He elected surveillance PSA with his PCP 10/05/2020 was 4.79 Given a trial of tamsulosin at last years visit for worsening lower urinary tract symptoms which she only took for a few months and states his voiding symptoms have improved PSA 10/05/2021 increased above baseline at 7.05   PMH: Past Medical History:  Diagnosis Date   Hypertension     Surgical History: History reviewed. No pertinent surgical history.  Home Medications:  Allergies as of 12/06/2021   No Known Allergies      Medication List        Accurate as of December 06, 2021 11:59 PM. If you have any questions, ask your nurse or doctor.          atorvastatin 20 MG tablet Commonly known as: LIPITOR Take 20 mg by mouth daily.   lisinopril-hydrochlorothiazide 20-25 MG tablet Commonly known as: ZESTORETIC Take 1 tablet by mouth daily.   metoprolol tartrate 25 MG tablet Commonly known as: LOPRESSOR metoprolol tartrate 25 mg tablet   sildenafil 20 MG tablet Commonly known as: REVATIO SMARTSIG:3-5 Tablet(s) By Mouth Daily PRN   tamsulosin 0.4 MG Caps capsule Commonly known as: FLOMAX TAKE 1 CAPSULE BY MOUTH EVERY DAY   traZODone 100 MG tablet Commonly known as: DESYREL Take 1 tablet by mouth at bedtime.        Allergies: No Known Allergies  Family History: History reviewed. No pertinent family history.  Social History:  reports that he has quit smoking. His smoking use included cigars. He has never used smokeless tobacco. No history on file for alcohol use and drug use.   Physical Exam: BP (!) 194/99   Pulse 90   Ht 6' (1.829 m)   Wt 210 lb (95.3 kg)   BMI 28.48 kg/m   Constitutional:  Alert and oriented,  No acute distress. HEENT: [place] AT Respiratory: Normal respiratory effort, no increased work of breathing. Psychiatric: Normal mood and affect.  Laboratory Data: Dipstick/microscopy negative   Assessment & Plan:    1.  Elevated PSA PSA increased 7.05 Schedule prostate MRI to evaluate for lesions suspicious for high-grade prostate cancer and aid in targeted biopsy of abnormalities identified.  Will contact with results ***  2.  BPH with LUTS Voiding symptoms have improved No longer on tamsulosin PVR today was 53 mL   Riki Altes, MD  [facility] [address] [place], [place] [address] [phone]

## 2021-12-07 LAB — PSA: Prostate Specific Ag, Serum: 8.7 ng/mL — ABNORMAL HIGH (ref 0.0–4.0)

## 2022-03-18 ENCOUNTER — Telehealth: Payer: Self-pay | Admitting: Urology

## 2022-03-18 NOTE — Telephone Encounter (Signed)
Patient was seen June 2023 and prostate MRI was recommended which it does not appear was ever scheduled.  Is he in agreement with getting the MRI scheduled?

## 2022-03-19 NOTE — Telephone Encounter (Signed)
Talked with patient and he is going to see his pcp in 2 weeks and talk to him. He will call us back

## 2022-05-23 ENCOUNTER — Encounter: Payer: Self-pay | Admitting: Cardiovascular Disease

## 2022-05-23 ENCOUNTER — Other Ambulatory Visit: Payer: Self-pay | Admitting: Cardiovascular Disease

## 2022-05-23 ENCOUNTER — Encounter: Payer: Self-pay | Admitting: *Deleted

## 2022-05-23 DIAGNOSIS — Z136 Encounter for screening for cardiovascular disorders: Secondary | ICD-10-CM

## 2022-05-31 ENCOUNTER — Ambulatory Visit
Admission: RE | Admit: 2022-05-31 | Discharge: 2022-05-31 | Disposition: A | Discharge status: Home / Self Care | Payer: Medicare Other | Source: Ambulatory Visit | Attending: Cardiovascular Disease | Admitting: Cardiovascular Disease

## 2022-05-31 DIAGNOSIS — Z136 Encounter for screening for cardiovascular disorders: Secondary | ICD-10-CM | POA: Insufficient documentation

## 2023-05-15 ENCOUNTER — Ambulatory Visit (INDEPENDENT_AMBULATORY_CARE_PROVIDER_SITE_OTHER): Payer: Medicare Other | Admitting: Urology

## 2023-05-15 VITALS — BP 129/81 | HR 88

## 2023-05-15 DIAGNOSIS — R972 Elevated prostate specific antigen [PSA]: Secondary | ICD-10-CM

## 2023-05-15 NOTE — Patient Instructions (Signed)
Prostate MRI Prep:  1- No ejaculation 48 hours prior to exam  2- No caffeine or carbonated beverages on day of the exam  3- Eat light diet evening prior and day of exam  4- Avoid eating 4 hours prior to exam  5- Fleets enema needs to be done 4 hours prior to exam -See below. Can be purchased at the drug store.

## 2023-05-15 NOTE — Progress Notes (Signed)
    I, Joshua Barrett, acting as a scribe for Riki Altes, MD., have documented all relevant documentation on the behalf of Riki Altes, MD, as directed by Riki Altes, MD while in the presence of Riki Altes, MD.  05/15/2023 4:06 PM   Joshua Barrett Oct 12, 1945 [id number]  Referring provider: Gracelyn Nurse, MD [address] [facility] [address] [facility] [place],  [place] [address]  Chief Complaint  Patient presents with   Elevated PSA   Urologic history: 1.  Elevated PSA    2.  BPH with LUTS  HPI: Joshua Barrett is a 77 y.o. male re-referred for an elevated PSA.   Initially seen for PSA of 4.89 in June 2021 and based on level and exam, surveillance was elected. PSA April 2023 had increased to 7.05 and prostate MRI was recommended. He elected not to schedule prostate MRI and stated he wanted to discuss MRI with his PCP and would call back if he wanted to schedule.  PSA 04/16/23 has increased to 11.62 Stable lower urinary tract symptoms.    PMH: Past Medical History:  Diagnosis Date   Hypertension      Home Medications:  Allergies as of 05/15/2023   No Known Allergies      Medication List        Accurate as of May 15, 2023  4:06 PM. If you have any questions, ask your nurse or doctor.          atorvastatin 20 MG tablet Commonly known as: LIPITOR Take 20 mg by mouth daily.   lisinopril-hydrochlorothiazide 20-25 MG tablet Commonly known as: ZESTORETIC Take 1 tablet by mouth daily.   metoprolol tartrate 25 MG tablet Commonly known as: LOPRESSOR metoprolol tartrate 25 mg tablet   sildenafil 20 MG tablet Commonly known as: REVATIO SMARTSIG:3-5 Tablet(s) By Mouth Daily PRN   tamsulosin 0.4 MG Caps capsule Commonly known as: FLOMAX TAKE 1 CAPSULE BY MOUTH EVERY DAY   traZODone 100 MG tablet Commonly known as: DESYREL Take 1 tablet by mouth at bedtime.        Allergies: No Known Allergies  Social History:  reports that he has  quit smoking. His smoking use included cigars. He has never used smokeless tobacco. No history on file for alcohol use and drug use.   Physical Exam: BP 129/81   Pulse 88   Constitutional:  Alert and oriented, No acute distress. HEENT: [place] AT Respiratory: Normal respiratory effort, no increased work of breathing. GI: Abdomen is soft, nontender, nondistended, no abdominal masses GU: Prostate 50 grams, smooth without nodules.  Psychiatric: Normal mood and affect.   Assessment & Plan:    1. Elevated PSA PSA increased 11.62 and was 7.72 04/10/22.  Recommend scheduling prostate MRI. He states he is extremely claustrophobic and was offered MRI under sedation-anesthesia at [facility] and elected to schedule there.  He will be contacted with the MRI results and further recommendations at that time.   I have reviewed the above documentation for accuracy and completeness, and I agree with the above.   Riki Altes, MD  [facility] [address] [place], [place] [address] [phone]

## 2023-05-17 ENCOUNTER — Encounter: Payer: Self-pay | Admitting: Urology

## 2023-05-17 NOTE — Addendum Note (Signed)
Addended by: Ples Specter L on: 05/17/2023 11:30 AM   Modules accepted: Orders

## 2023-06-17 ENCOUNTER — Telehealth: Payer: Self-pay | Admitting: Urology

## 2023-10-10 ENCOUNTER — Ambulatory Visit (HOSPITAL_COMMUNITY): Admit: 2023-10-10 | Payer: Medicare Other

## 2023-10-10 ENCOUNTER — Ambulatory Visit (HOSPITAL_COMMUNITY): Payer: Medicare Other

## 2023-10-10 SURGERY — MRI WITH ANESTHESIA
Anesthesia: General
